# Patient Record
Sex: Male | Born: 2000 | Race: White | Hispanic: No | Marital: Single | Smoking: Never smoker
Health system: Southern US, Community
[De-identification: ages and names within clinical notes are randomized; demographics above are authoritative.]

## PROBLEM LIST (undated history)

## (undated) DIAGNOSIS — R51 Headache: Secondary | ICD-10-CM

## (undated) DIAGNOSIS — R519 Headache, unspecified: Secondary | ICD-10-CM

## (undated) HISTORY — PX: TONSILLECTOMY: SUR1361

## (undated) HISTORY — DX: Headache: R51

## (undated) HISTORY — DX: Headache, unspecified: R51.9

## (undated) HISTORY — PX: MYRINGOTOMY WITH TUBE PLACEMENT: SHX5663

## (undated) HISTORY — PX: ADENOIDECTOMY: SUR15

---
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
## 2021-10-10 ENCOUNTER — Emergency Department (INDEPENDENT_AMBULATORY_CARE_PROVIDER_SITE_OTHER)
Admission: EM | Admit: 2021-10-10 | Discharge: 2021-10-10 | Disposition: A | Discharge status: Home / Self Care | Payer: 59 | Source: Home / Self Care

## 2021-10-10 ENCOUNTER — Other Ambulatory Visit: Payer: Self-pay

## 2021-10-10 DIAGNOSIS — B370 Candidal stomatitis: Secondary | ICD-10-CM

## 2021-10-10 DIAGNOSIS — R5383 Other fatigue: Secondary | ICD-10-CM | POA: Diagnosis not present

## 2021-10-10 MED ORDER — NYSTATIN 100000 UNIT/ML MT SUSP: 500000.0000 [IU] | Freq: Four times a day (QID) | OROMUCOSAL | 0 refills | Status: AC

## 2021-10-10 NOTE — ED Provider Notes (Signed)
Jeffrey Calhoun [facility]    CSN: [id number] Arrival date & time: 10/10/21  1243      History   Chief Complaint Chief Complaint  Patient presents with   Fatigue   Oral Pain    Roof of mouth    HPI Jeffrey Calhoun is a 20 y.o. male.   HPI 20 year old male presents with mouth pain and fatigue for 3 days.  Additionally planes of fatigue and some congestion.  Patient reports taking OTC DayQuil/Ibuprofen.  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 Authorizing Provider  nystatin (MYCOSTATIN) 100000 UNIT/ML suspension Take 5 mLs (500,000 Units total) by mouth 4 (four) times daily. 10/10/21  Yes Trevor Iha, FNP    Family History Family History  Problem Relation Age of Onset   Cancer Maternal Grandmother    Cancer Maternal Grandfather     Social History Social History   Tobacco Use   Smoking status: Never   Smokeless tobacco: Never  Substance Use Topics   Alcohol use: No   Drug use: No     Allergies   Seasonal ic [cholestatin]   Review of Systems Review of Systems  HENT:  Positive for congestion.        Mouth pain x3 days.  All other systems reviewed and are negative.   Physical Exam Triage Vital Signs ED Triage Vitals [10/10/21 1304]  Enc Vitals Group     BP 121/69     Pulse Rate 60     Resp 17      Temp 98.3 F (36.8 C)     Temp Source Oral     SpO2 97 %     Weight      Height      Head Circumference      Peak Flow      Pain Score 5     Pain Loc      Pain Edu?      Excl. in GC?    No data found.  Updated Vital Signs BP 121/69 (BP Location: Left Arm)   Pulse 60   Temp 98.3 F (36.8 C) (Oral)   Resp 17   SpO2 97%    Physical Exam Vitals and nursing note reviewed.  Constitutional:      General: He is not in acute distress.    Appearance: Normal appearance. He is normal weight. He is not ill-appearing.  HENT:     Head: Normocephalic and atraumatic.     Right Ear: Tympanic membrane, ear canal and external ear normal.     Left Ear: Tympanic membrane, ear canal and external ear normal.     Mouth/Throat:     Mouth: Mucous membranes are moist.     Pharynx: Oropharynx is clear.     Comments: Moderate amount  of creamy white patches of hard palate and surface of tongue noted Eyes:     Extraocular Movements: Extraocular movements intact.     Conjunctiva/sclera: Conjunctivae normal.     Pupils: Pupils are equal, round, and reactive to light.  Cardiovascular:     Rate and Rhythm: Normal rate and regular rhythm.     Pulses: Normal pulses.     Heart sounds: Normal heart sounds.  Pulmonary:     Effort: Pulmonary effort is normal.     Breath sounds: Normal breath sounds. No wheezing, rhonchi or rales.  Musculoskeletal:        General: Normal range of motion.     Cervical back: Normal range of motion and neck supple.  Skin:    General: Skin is warm and dry.  Neurological:     General: No focal deficit present.     Mental Status: He is alert and oriented to person, place, and time. Mental status is at baseline.  Psychiatric:        Mood and Affect: Mood normal.        Behavior: Behavior normal.        Thought Content: Thought content normal.     UC Treatments / Results  Labs (all labs ordered are listed, but only abnormal results are displayed) Labs Reviewed   COVID-19, FLU A+B NAA    EKG   Radiology No results found.  Procedures Procedures (including critical care time)  Medications Ordered in UC Medications - No data to display  Initial Impression / Assessment and Plan / UC Course  I have reviewed the triage vital signs and the nursing notes.  Pertinent labs & imaging results that were available during my care of the patient were reviewed by me and considered in my medical decision making (see chart for details).     MDM: 1.  Oral candidiasis-Rx'd Mycostatin; 2.  Fatigue-COVID-19/flu A/B ordered. Advised patient take medication as directed.  Advised patient we will follow-up with COVID-19 flu A/B results once received.  School note provided per patient's request.  Patient discharged home, hemodynamically stable. Final Clinical Impressions(s) / UC Diagnoses   Final diagnoses:  Fatigue, unspecified type  Oral candidiasis     Discharge Instructions      Advised patient take medication as directed.  Advised patient we will follow-up with COVID-19 flu A/B results once received.  School note provided per patient's request.     ED Prescriptions     Medication Sig Dispense Auth. Provider   nystatin (MYCOSTATIN) 100000 UNIT/ML suspension Take 5 mLs (500,000 Units total) by mouth 4 (four) times daily. 473 mL Trevor Iha, FNP      PDMP not reviewed this encounter.   Trevor Iha, FNP 10/10/21 1341

## 2021-10-10 NOTE — ED Triage Notes (Signed)
Pt c/o mouth pain (roof) x 3 days. Also c/o fatigue. Some congestion. Mouth pain has gotten worse since started. States mom looked in his mouth and says the roof of his mouth looked bruised. Dayquil and ibuprofen prn.

## 2021-10-10 NOTE — Discharge Instructions (Addendum)
Advised patient take medication as directed.  Advised patient we will follow-up with COVID-19 flu A/B results once received.  School note provided per patient's request.

## 2021-10-12 LAB — COVID-19, FLU A+B NAA
Influenza A, NAA: NOT DETECTED
Influenza B, NAA: NOT DETECTED
SARS-CoV-2, NAA: NOT DETECTED

## 2021-10-15 DIAGNOSIS — K120 Recurrent oral aphthae: Secondary | ICD-10-CM | POA: Diagnosis not present

## 2021-10-15 DIAGNOSIS — W57XXXA Bitten or stung by nonvenomous insect and other nonvenomous arthropods, initial encounter: Secondary | ICD-10-CM | POA: Diagnosis not present

## 2021-10-15 DIAGNOSIS — R5383 Other fatigue: Secondary | ICD-10-CM | POA: Diagnosis not present

## 2021-10-15 DIAGNOSIS — K1230 Oral mucositis (ulcerative), unspecified: Secondary | ICD-10-CM | POA: Diagnosis not present

## 2022-06-10 DIAGNOSIS — Z6820 Body mass index (BMI) 20.0-20.9, adult: Secondary | ICD-10-CM | POA: Diagnosis not present

## 2022-06-10 DIAGNOSIS — J029 Acute pharyngitis, unspecified: Secondary | ICD-10-CM | POA: Diagnosis not present

## 2022-12-16 DIAGNOSIS — Z6820 Body mass index (BMI) 20.0-20.9, adult: Secondary | ICD-10-CM | POA: Diagnosis not present

## 2022-12-16 DIAGNOSIS — J069 Acute upper respiratory infection, unspecified: Secondary | ICD-10-CM | POA: Diagnosis not present

## 2023-02-20 ENCOUNTER — Encounter: Payer: Self-pay | Admitting: Emergency Medicine

## 2023-02-20 ENCOUNTER — Ambulatory Visit
Admission: EM | Admit: 2023-02-20 | Discharge: 2023-02-20 | Disposition: A | Discharge status: Home / Self Care | Payer: Medicaid Other | Attending: Family Medicine | Admitting: Family Medicine

## 2023-02-20 DIAGNOSIS — H73011 Bullous myringitis, right ear: Secondary | ICD-10-CM

## 2023-02-20 MED ORDER — FLUTICASONE PROPIONATE 50 MCG/ACT NA SUSP: 2.0000 | Freq: Every day | NASAL | 0 refills | Status: AC

## 2023-02-20 MED ORDER — AZITHROMYCIN 250 MG PO TABS: ORAL_TABLET | ORAL | 0 refills | Status: AC

## 2023-02-20 NOTE — Discharge Instructions (Signed)
Make sure you are drinking lots of water Take the Z-Pak as directed.  2 pills today then 1 a day until gone Use Flonase daily until your symptoms have improved See your primary care doctor if not better by next week

## 2023-02-20 NOTE — ED Provider Notes (Signed)
[facility]    CSN: [id number] Arrival date & time: 02/20/23  1050      History   Chief Complaint Chief Complaint  Patient presents with   Ear Fullness    HPI Jeffrey Calhoun is a 22 y.o. male.   HPI  Patient states that he had a viral upper respiratory infection about a week ago.  He had some cough cold and runny nose, sore throat, nasal congestion.  This all seemed to get better but now he has pressure and pain in his right ear and diminished hearing.  He is not prone to ear infections.  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 Authorizing Provider  azithromycin (ZITHROMAX Z-PAK) 250 MG tablet Take two pills today followed by one a day until gone 02/20/23  Yes Raylene Everts, MD  fluticasone [facility]) 50 MCG/ACT nasal spray Place 2 sprays into both nostrils daily. 02/20/23  Yes Raylene Everts, MD  nystatin (MYCOSTATIN) 100000 UNIT/ML suspension Take 5 mLs (500,000 Units total) by mouth 4 (four) times daily. 10/10/21   Eliezer Lofts, FNP    Family History Family History  Problem Relation Age of Onset   Cancer Maternal Grandmother    Cancer Maternal Grandfather     Social History Social History   Tobacco Use   Smoking status: Never   Smokeless tobacco: Never   Substance Use Topics   Alcohol use: No   Drug use: No     Allergies   Seasonal ic [cholestatin]   Review of Systems Review of Systems  See HPI Physical Exam Triage Vital Signs ED Triage Vitals  Enc Vitals Group     BP 02/20/23 1131 124/71     Pulse Rate 02/20/23 1131 78     Resp 02/20/23 1131 16     Temp 02/20/23 1131 98.4 F (36.9 C)     Temp Source 02/20/23 1131 Oral     SpO2 02/20/23 1131 98 %     Weight --      Height --      Head Circumference --      Peak Flow --      Pain Score 02/20/23 1151 0     Pain Loc --      Pain Edu? --      Excl. in [place]? --    No data found.  Updated Vital Signs BP 124/71 (BP Location: Left Arm)   Pulse 78   Temp 98.4 F (36.9 C) (Oral)   Resp 16   SpO2 98%      Physical Exam Constitutional:      General: He is not in acute distress.    Appearance: He is well-developed.  HENT:  Head: Normocephalic and atraumatic.     Right Ear: Ear canal and external ear normal.     Left Ear: Tympanic membrane, ear canal and external ear normal.     Ears:     Comments: Right TM is bulging, dull, small bulla present Eyes:     Conjunctiva/sclera: Conjunctivae normal.     Pupils: Pupils are equal, round, and reactive to light.  Cardiovascular:     Rate and Rhythm: Normal rate and regular rhythm.     Heart sounds: Normal heart sounds.  Pulmonary:     Effort: Pulmonary effort is normal. No respiratory distress.     Breath sounds: Normal breath sounds.  Abdominal:     General: There is no distension.     Palpations: Abdomen is soft.  Musculoskeletal:        General: Normal range of motion.     Cervical back: Normal range of motion.  Skin:    General: Skin is warm and dry.  Neurological:     Mental Status: He is alert.      UC Treatments / Results  Labs (all labs ordered are listed, but only abnormal results are displayed) Labs Reviewed - No data to display  EKG   Radiology No results found.  Procedures Procedures  (including critical care time)  Medications Ordered in UC Medications - No data to display  Initial Impression / Assessment and Plan / UC Course  I have reviewed the triage vital signs and the nursing notes.  Pertinent labs & imaging results that were available during my care of the patient were reviewed by me and considered in my medical decision making (see chart for details).     Final Clinical Impressions(s) / UC Diagnoses   Final diagnoses:  Bullous myringitis of right ear     Discharge Instructions      Make sure you are drinking lots of water Take the Z-Pak as directed.  2 pills today then 1 a day until gone Use Flonase daily until your symptoms have improved See your primary care doctor if not better by next week   ED Prescriptions     Medication Sig Dispense Auth. Provider   azithromycin (ZITHROMAX Z-PAK) 250 MG tablet Take two pills today followed by one a day until gone 6 tablet Raylene Everts, MD   fluticasone [facility]) 50 MCG/ACT nasal spray Place 2 sprays into both nostrils daily. 16 g Raylene Everts, MD      PDMP not reviewed this encounter.   Raylene Everts, MD 02/20/23 [phone]

## 2023-02-20 NOTE — ED Triage Notes (Addendum)
Patient reports he had a viral URI 1 week prior, and began having some right ear fullness that developed around that time and has gotten progressively worse. Denies pain and fever. Was taking nyquil and mucinex D without improvement. States he also began using ear drops two days ago that have been in the medicine cabinet for over a year, unsure of name.

## 2023-03-05 DIAGNOSIS — R6889 Other general symptoms and signs: Secondary | ICD-10-CM | POA: Diagnosis not present

## 2023-03-05 DIAGNOSIS — Z20828 Contact with and (suspected) exposure to other viral communicable diseases: Secondary | ICD-10-CM | POA: Diagnosis not present

## 2023-03-06 DIAGNOSIS — R509 Fever, unspecified: Secondary | ICD-10-CM | POA: Diagnosis not present

## 2023-03-06 DIAGNOSIS — G44311 Acute post-traumatic headache, intractable: Secondary | ICD-10-CM | POA: Diagnosis not present

## 2023-03-06 DIAGNOSIS — R42 Dizziness and giddiness: Secondary | ICD-10-CM | POA: Diagnosis not present

## 2023-03-06 DIAGNOSIS — R519 Headache, unspecified: Secondary | ICD-10-CM | POA: Diagnosis not present

## 2023-04-15 DIAGNOSIS — L237 Allergic contact dermatitis due to plants, except food: Secondary | ICD-10-CM | POA: Diagnosis not present

## 2023-10-08 ENCOUNTER — Other Ambulatory Visit: Payer: Self-pay | Admitting: Family Medicine

## 2023-10-08 DIAGNOSIS — N63 Unspecified lump in unspecified breast: Secondary | ICD-10-CM

## 2023-10-08 DIAGNOSIS — N62 Hypertrophy of breast: Secondary | ICD-10-CM

## 2023-10-21 ENCOUNTER — Other Ambulatory Visit: Payer: Medicaid Other

## 2023-10-21 ENCOUNTER — Ambulatory Visit
Admission: RE | Admit: 2023-10-21 | Discharge: 2023-10-21 | Disposition: A | Discharge status: Home / Self Care | Payer: Medicaid Other | Source: Ambulatory Visit | Attending: Family Medicine | Admitting: Family Medicine

## 2023-10-21 DIAGNOSIS — N62 Hypertrophy of breast: Secondary | ICD-10-CM

## 2023-10-21 DIAGNOSIS — N63 Unspecified lump in unspecified breast: Secondary | ICD-10-CM
